# Patient Record
Sex: Female | Born: 1975 | Race: White | Hispanic: No | Marital: Married | Smoking: Current every day smoker
Health system: Southern US, Community
[De-identification: ages and names within clinical notes are randomized; demographics above are authoritative.]

## PROBLEM LIST (undated history)

## (undated) DIAGNOSIS — E7800 Pure hypercholesterolemia, unspecified: Secondary | ICD-10-CM

## (undated) DIAGNOSIS — I10 Essential (primary) hypertension: Secondary | ICD-10-CM

---

## 2003-06-05 ENCOUNTER — Other Ambulatory Visit: Payer: Self-pay

## 2003-09-06 ENCOUNTER — Other Ambulatory Visit: Payer: Self-pay

## 2003-12-22 ENCOUNTER — Emergency Department: Payer: Self-pay | Admitting: Emergency Medicine

## 2003-12-31 ENCOUNTER — Other Ambulatory Visit: Payer: Self-pay

## 2003-12-31 ENCOUNTER — Emergency Department: Payer: Self-pay | Admitting: Emergency Medicine

## 2004-01-01 ENCOUNTER — Ambulatory Visit: Payer: Self-pay | Admitting: Emergency Medicine

## 2004-01-03 ENCOUNTER — Emergency Department: Payer: Self-pay | Admitting: Emergency Medicine

## 2004-01-05 ENCOUNTER — Emergency Department: Payer: Self-pay | Admitting: Unknown Physician Specialty

## 2004-02-06 ENCOUNTER — Emergency Department: Payer: Self-pay | Admitting: Emergency Medicine

## 2004-02-16 ENCOUNTER — Emergency Department: Payer: Self-pay | Admitting: Emergency Medicine

## 2004-03-13 ENCOUNTER — Emergency Department: Payer: Self-pay | Admitting: Emergency Medicine

## 2004-03-20 ENCOUNTER — Ambulatory Visit: Payer: Self-pay | Admitting: Otolaryngology

## 2004-04-01 ENCOUNTER — Emergency Department: Payer: Self-pay | Admitting: Emergency Medicine

## 2005-09-01 ENCOUNTER — Emergency Department: Payer: Self-pay | Admitting: Internal Medicine

## 2005-10-31 ENCOUNTER — Emergency Department: Payer: Self-pay | Admitting: Emergency Medicine

## 2005-11-21 ENCOUNTER — Emergency Department: Payer: Self-pay | Admitting: Emergency Medicine

## 2006-01-12 ENCOUNTER — Emergency Department: Payer: Self-pay | Admitting: Emergency Medicine

## 2006-12-22 ENCOUNTER — Emergency Department: Payer: Self-pay | Admitting: Emergency Medicine

## 2007-01-11 ENCOUNTER — Emergency Department: Payer: Self-pay | Admitting: Emergency Medicine

## 2007-04-08 ENCOUNTER — Emergency Department: Payer: Self-pay | Admitting: Emergency Medicine

## 2007-05-23 ENCOUNTER — Ambulatory Visit: Payer: Self-pay | Admitting: Family Medicine

## 2007-09-01 ENCOUNTER — Ambulatory Visit: Payer: Self-pay | Admitting: Family Medicine

## 2007-09-20 ENCOUNTER — Ambulatory Visit: Payer: Self-pay | Admitting: Family Medicine

## 2007-11-30 ENCOUNTER — Ambulatory Visit: Payer: Self-pay | Admitting: Obstetrics and Gynecology

## 2007-12-17 ENCOUNTER — Observation Stay: Payer: Self-pay | Admitting: Obstetrics and Gynecology

## 2007-12-24 ENCOUNTER — Ambulatory Visit: Payer: Self-pay | Admitting: Internal Medicine

## 2008-04-18 ENCOUNTER — Ambulatory Visit: Payer: Self-pay | Admitting: Internal Medicine

## 2008-08-30 ENCOUNTER — Ambulatory Visit: Payer: Self-pay | Admitting: Internal Medicine

## 2008-10-24 ENCOUNTER — Emergency Department: Payer: Self-pay | Admitting: Emergency Medicine

## 2009-03-04 ENCOUNTER — Emergency Department: Payer: Self-pay | Admitting: Emergency Medicine

## 2009-04-18 ENCOUNTER — Ambulatory Visit: Payer: Self-pay | Admitting: Internal Medicine

## 2009-08-29 ENCOUNTER — Ambulatory Visit: Payer: Self-pay | Admitting: Obstetrics and Gynecology

## 2009-12-09 ENCOUNTER — Ambulatory Visit: Payer: Self-pay | Admitting: Internal Medicine

## 2009-12-25 ENCOUNTER — Ambulatory Visit: Payer: Self-pay | Admitting: Internal Medicine

## 2010-02-22 ENCOUNTER — Emergency Department: Payer: Self-pay | Admitting: Emergency Medicine

## 2010-02-22 ENCOUNTER — Ambulatory Visit: Payer: Self-pay | Admitting: Internal Medicine

## 2011-05-28 ENCOUNTER — Ambulatory Visit: Payer: Self-pay | Admitting: Obstetrics and Gynecology

## 2011-09-11 ENCOUNTER — Emergency Department: Payer: Self-pay | Admitting: Internal Medicine

## 2011-09-14 ENCOUNTER — Emergency Department: Payer: Self-pay | Admitting: Emergency Medicine

## 2012-01-26 ENCOUNTER — Ambulatory Visit: Payer: Self-pay

## 2012-02-02 ENCOUNTER — Ambulatory Visit: Payer: Self-pay

## 2012-02-16 ENCOUNTER — Ambulatory Visit: Payer: Self-pay

## 2012-02-23 ENCOUNTER — Ambulatory Visit: Payer: Self-pay

## 2012-03-08 ENCOUNTER — Ambulatory Visit: Payer: Self-pay

## 2012-09-22 ENCOUNTER — Ambulatory Visit: Payer: Self-pay | Admitting: Family Medicine

## 2012-11-15 ENCOUNTER — Emergency Department (HOSPITAL_COMMUNITY): Payer: Worker's Compensation

## 2012-11-15 ENCOUNTER — Emergency Department (HOSPITAL_COMMUNITY)
Admission: EM | Admit: 2012-11-15 | Discharge: 2012-11-15 | Disposition: A | Discharge status: Home / Self Care | Payer: Worker's Compensation | Attending: Emergency Medicine | Admitting: Emergency Medicine

## 2012-11-15 ENCOUNTER — Encounter (HOSPITAL_COMMUNITY): Payer: Self-pay | Admitting: Emergency Medicine

## 2012-11-15 DIAGNOSIS — F17200 Nicotine dependence, unspecified, uncomplicated: Secondary | ICD-10-CM | POA: Insufficient documentation

## 2012-11-15 DIAGNOSIS — Y990 Civilian activity done for income or pay: Secondary | ICD-10-CM | POA: Insufficient documentation

## 2012-11-15 DIAGNOSIS — M79672 Pain in left foot: Secondary | ICD-10-CM

## 2012-11-15 DIAGNOSIS — Z8639 Personal history of other endocrine, nutritional and metabolic disease: Secondary | ICD-10-CM | POA: Insufficient documentation

## 2012-11-15 DIAGNOSIS — Y9389 Activity, other specified: Secondary | ICD-10-CM | POA: Insufficient documentation

## 2012-11-15 DIAGNOSIS — S8990XA Unspecified injury of unspecified lower leg, initial encounter: Secondary | ICD-10-CM | POA: Insufficient documentation

## 2012-11-15 DIAGNOSIS — Y9289 Other specified places as the place of occurrence of the external cause: Secondary | ICD-10-CM | POA: Insufficient documentation

## 2012-11-15 DIAGNOSIS — Z862 Personal history of diseases of the blood and blood-forming organs and certain disorders involving the immune mechanism: Secondary | ICD-10-CM | POA: Insufficient documentation

## 2012-11-15 DIAGNOSIS — W208XXA Other cause of strike by thrown, projected or falling object, initial encounter: Secondary | ICD-10-CM | POA: Insufficient documentation

## 2012-11-15 DIAGNOSIS — I10 Essential (primary) hypertension: Secondary | ICD-10-CM | POA: Insufficient documentation

## 2012-11-15 HISTORY — DX: Essential (primary) hypertension: I10

## 2012-11-15 HISTORY — DX: Pure hypercholesterolemia, unspecified: E78.00

## 2012-11-15 NOTE — ED Notes (Addendum)
Left foot injury at work  After having heavy metal plate crunch foot  Need drug test for work

## 2012-11-15 NOTE — ED Provider Notes (Signed)
CSN: [id number]     Arrival date & time 11/15/12  1454 History  This chart was scribed for non-physician practitioner, Sharilyn Sites, PA-C working with Richardean Canal, MD by Greggory Stallion, ED scribe. This patient was seen in room TR06C/TR06C and the patient's care was started at 5:18 PM.   Chief Complaint  Patient presents with  . Foot Pain   The history is provided by the patient. No language interpreter was used.   HPI Comments: Caitlin Key is a 37 y.o. female who presents to the Emergency Department complaining of left foot injury that occurred earlier today at work. She states she was trying to lift a propane tank and dropped it on her foot. Pt has sudden onset throbbing left medial foot pain and some intermittent tingling. She states bearing weight worsens the pain but she is able to ambulate. Pt denies numbness.  No prior foot injuries.  No meds taken PTA.  States she needs UDS performed for work related injury.  Past Medical History  Diagnosis Date  . Hypertension   . High cholesterol    No past surgical history on file. No family history on file. History  Substance Use Topics  . Smoking status: Current Every Day Smoker  . Smokeless tobacco: Not on file  . Alcohol Use: Yes   OB History   Grav Para Term Preterm Abortions TAB SAB Ect Mult Living                 Review of Systems  Musculoskeletal: Positive for arthralgias.  Neurological: Negative for numbness.  All other systems reviewed and are negative.    Allergies  Sulfa antibiotics  Home Medications  No current outpatient prescriptions on file.  BP 125/77  Pulse 87  Temp(Src) 97.7 F (36.5 C) (Oral)  Resp 16  Wt 201 lb 3 oz (91.258 kg)  SpO2 97%  Physical Exam  Nursing note and vitals reviewed. Constitutional: She is oriented to person, place, and time. She appears well-developed and well-nourished. No distress.  HENT:  Head: Normocephalic and atraumatic.  Eyes: Conjunctivae and EOM are normal. Pupils  are equal, round, and reactive to light.  Neck: Normal range of motion. Neck supple.  Cardiovascular: Normal rate, regular rhythm and normal heart sounds.   Pulmonary/Chest: Effort normal and breath sounds normal. No respiratory distress. She has no wheezes.  Abdominal: Soft. Bowel sounds are normal. There is no tenderness. There is no guarding.  Musculoskeletal: Normal range of motion.       Left foot: She exhibits tenderness and swelling. She exhibits no deformity and no laceration.       Feet:  Tenderness to palpation of left medial foot. Mild swelling at proximal arch without bruising or deformity. Full ROM of ankle and toes. Strong distal pulse. Cap refill normal.   Neurological: She is alert and oriented to person, place, and time.  Sensation intact.   Skin: Skin is warm and dry. She is not diaphoretic.  Psychiatric: She has a normal mood and affect.    ED Course  ORTHOPEDIC INJURY TREATMENT Date/Time: 11/15/2012 6:49 PM Performed by: Garlon Hatchet Authorized by: Garlon Hatchet Consent: Verbal consent obtained. Consent given by: patient Patient understanding: patient states understanding of the procedure being performed Patient identity confirmed: verbally with patient Injury location: foot Location details: left foot Injury type: soft tissue Pre-procedure distal perfusion: normal Pre-procedure neurological function: normal Pre-procedure range of motion: normal Supplies used: elastic bandage Post-procedure neurovascular assessment: post-procedure neurovascularly intact Comments:  ACE wrap applied.  Normal neurovascular exam pre and post procedure.  Pt tolerated well without complication.   (including critical care time)  DIAGNOSTIC STUDIES: Oxygen Saturation is 97% on RA, normal by my interpretation.    COORDINATION OF CARE: 5:22 PM-Discussed treatment plan which includes pain medication with pt at bedside and pt agreed to plan. Advised pt to follow up with orthopedics  if symptoms do not resolve.   Labs Review Labs Reviewed - No data to display Imaging Review Dg Foot Complete Left  11/15/2012   CLINICAL DATA:  Left foot pain post fall  EXAM: LEFT FOOT - COMPLETE 3+ VIEW  COMPARISON:  None.  FINDINGS: Three views of left foot submitted. No acute fracture or subluxation. Plantar spur of calcaneus is noted.  IMPRESSION: No acute fracture or subluxation. Plantar spurring of calcaneus.   Electronically Signed   By: Natasha Mead M.D.   On: 11/15/2012 17:02    EKG Interpretation   None       MDM   1. Foot pain, left    X-ray negative for acute fracture or dislocation. Left foot stabilized with Ace wrap.  UDS collected by phlebotomy.  FU with PCP if problems occur.  Discussed plan with pt, she agreed.  Return precautions advised.  I personally performed the services described in this documentation, which was scribed in my presence. The recorded information has been reviewed and is accurate.   Garlon Hatchet, PA-C 11/15/12 [id number]

## 2012-11-15 NOTE — ED Provider Notes (Signed)
Medical screening examination/treatment/procedure(s) were performed by non-physician practitioner and as supervising physician I was immediately available for consultation/collaboration.  EKG Interpretation   None         Richardean Canal, MD 11/15/12 2246

## 2012-11-15 NOTE — ED Notes (Signed)
Patient reports an injury at work.  States that she stepped in a water access and injured her left foot. States that her arch is throbbing.  RN notes slight swelling on her left medial foot.

## 2012-11-16 ENCOUNTER — Telehealth (HOSPITAL_BASED_OUTPATIENT_CLINIC_OR_DEPARTMENT_OTHER): Payer: Self-pay

## 2013-03-26 ENCOUNTER — Emergency Department: Payer: Self-pay | Admitting: Emergency Medicine

## 2013-03-26 LAB — CBC
HCT: 39.8 % (ref 35.0–47.0)
HGB: 13.3 g/dL (ref 12.0–16.0)
MCH: 32.7 pg (ref 26.0–34.0)
MCHC: 33.4 g/dL (ref 32.0–36.0)
MCV: 98 fL (ref 80–100)
Platelet: 313 10*3/uL (ref 150–440)
RBC: 4.07 10*6/uL (ref 3.80–5.20)
RDW: 12.3 % (ref 11.5–14.5)
WBC: 6 10*3/uL (ref 3.6–11.0)

## 2013-03-26 LAB — BASIC METABOLIC PANEL WITH GFR
Anion Gap: 5 — ABNORMAL LOW
BUN: 13 mg/dL
Calcium, Total: 9.1 mg/dL
Chloride: 103 mmol/L
Co2: 27 mmol/L
Creatinine: 0.85 mg/dL
EGFR (African American): >60
EGFR (Non-African Amer.): >60
Glucose: 77 mg/dL
Osmolality: 269
Potassium: 4 mmol/L
Sodium: 135 mmol/L — ABNORMAL LOW

## 2013-03-26 LAB — URINALYSIS, COMPLETE
Bacteria: NONE SEEN
Bilirubin,UR: NEGATIVE
Blood: NEGATIVE
GLUCOSE, UR: NEGATIVE mg/dL (ref 0–75)
KETONE: NEGATIVE
Leukocyte Esterase: NEGATIVE
NITRITE: NEGATIVE
Ph: 7 (ref 4.5–8.0)
Protein: NEGATIVE
RBC,UR: <1 /HPF (ref 0–5)
SQUAMOUS EPITHELIAL: NONE SEEN
Specific Gravity: 1.005 (ref 1.003–1.030)
WBC UR: NONE SEEN /HPF (ref 0–5)

## 2013-03-26 LAB — PRO B NATRIURETIC PEPTIDE: B-Type Natriuretic Peptide: 331 pg/mL — ABNORMAL HIGH

## 2013-03-26 LAB — TROPONIN I: Troponin-I: <0.02 ng/mL

## 2013-04-07 ENCOUNTER — Ambulatory Visit: Payer: Self-pay | Admitting: Cardiology

## 2013-04-19 ENCOUNTER — Ambulatory Visit: Payer: Self-pay | Admitting: Vascular Surgery

## 2013-04-19 LAB — URINALYSIS, COMPLETE
Bilirubin,UR: NEGATIVE
Blood: NEGATIVE
Glucose,UR: NEGATIVE mg/dL (ref 0–75)
Ketone: NEGATIVE
LEUKOCYTE ESTERASE: NEGATIVE
Nitrite: NEGATIVE
PROTEIN: NEGATIVE
Ph: 5 (ref 4.5–8.0)
Specific Gravity: 1.021 (ref 1.003–1.030)

## 2013-04-19 LAB — BASIC METABOLIC PANEL
Anion Gap: 3 — ABNORMAL LOW (ref 7–16)
BUN: 20 mg/dL — ABNORMAL HIGH (ref 7–18)
Calcium, Total: 9.3 mg/dL (ref 8.5–10.1)
Chloride: 102 mmol/L (ref 98–107)
Co2: 29 mmol/L (ref 21–32)
Creatinine: 1.06 mg/dL (ref 0.60–1.30)
EGFR (African American): >60
EGFR (Non-African Amer.): >60
Glucose: 83 mg/dL (ref 65–99)
Osmolality: 270 (ref 275–301)
Potassium: 4 mmol/L (ref 3.5–5.1)
Sodium: 134 mmol/L — ABNORMAL LOW (ref 136–145)

## 2013-04-19 LAB — CBC
HCT: 40.7 % (ref 35.0–47.0)
HGB: 13.4 g/dL (ref 12.0–16.0)
MCH: 32.5 pg (ref 26.0–34.0)
MCHC: 32.9 g/dL (ref 32.0–36.0)
MCV: 99 fL (ref 80–100)
Platelet: 350 10*3/uL (ref 150–440)
RBC: 4.12 10*6/uL (ref 3.80–5.20)
RDW: 12.9 % (ref 11.5–14.5)
WBC: 5.8 10*3/uL (ref 3.6–11.0)

## 2013-04-19 LAB — APTT: Activated PTT: 54.7 secs — ABNORMAL HIGH (ref 23.6–35.9)

## 2013-04-21 LAB — PATHOLOGY REPORT

## 2014-05-06 NOTE — Op Note (Signed)
PATIENT NAME:  Caitlin Key, [name] MR#:  [id number] DATE OF BIRTH:  Jan 09, 1976  DATE OF PROCEDURE:  04/19/2013  PREOPERATIVE DIAGNOSES: 1.  Thrombosis of right radial artery, status post catheterization.  2.  Hypertension.  3.  Obesity.   POSTOPERATIVE DIAGNOSES: 1.  Thrombosis of right radial artery, status post catheterization.  2.  Hypertension.  3.  Obesity.   PROCEDURE PERFORMED:  Open exploration of the radial artery at the wrist with a Fogarty  thrombectomy of the radial artery.   SURGEON: Renford DillsGregory G. Dariah Mcsorley MD  ANESTHESIA: General by LMA.   FLUIDS: Per anesthesia record.   ESTIMATED BLOOD LOSS: 50 mL.   SPECIMEN: Thrombus recovered from the artery to pathology permanent section.   INDICATIONS: Ms. [name] is a 39 year old woman who underwent cardiac catheterization recently and developed increasing pain in her right arm. The physical examination as well as noninvasive studies demonstrated thrombosis of her right radial artery. The risks and benefits for open thrombectomy were reviewed. All questions were answered. The patient agrees to proceed.   DESCRIPTION OF PROCEDURE: The patient is taken to the Operating Room and placed in the supine position. After adequate general anesthesia is induced and appropriate invasive monitors are placed, she is positioned supine with her right arm extended palm upward. The right arm is prepped and draped in a circumferential fashion. Appropriate timeout is called.   A linear incision is then created overlying the long axis of the radial artery at the wrist and the dissection is carried down opening the fascia and exposing the radial artery which is dissected proximally and distally circumferentially and looped with Silastic vessel loops. Heparin 5000 units was given. A transverse incision was created with an 11-blade scalpel and subsequently a 2-French Fogarty catheter was utilized to perform thrombectomy. Two passes were made going distally with  return of pulsatile blood flow. Approximately 4 passes were made going proximally with return of pulsatile blood flow. The radial artery was then irrigated copiously with heparinized saline and closed using interrupted 7-0 Prolene sutures. Flushing maneuvers were performed before the last 2 stitches were secured.   A small amount of Avitene and a piece of Surgicel were placed along the artery after the wound is irrigated and incision is then closed in layers using running 3-0 Vicryl followed by 4-0 Monocryl subcuticular and then Dermabond. The patient tolerated the procedure well. There were no immediate complications. Sponge and needle counts were correct and she is taken to the recovery area in stable condition.   ____________________________ Renford DillsGregory G. Michelangelo Rindfleisch, MD ggs:cs D: 04/19/2013 19:17:01 ET T: 04/19/2013 19:26:53 ET JOB#: [id number]  cc: Renford DillsGregory G. Tykira Wachs, MD, <Dictator> Meindert A. Lacie ScottsNiemeyer, MD Marcina MillardAlexander Paraschos, MD Renford DillsGREGORY G Julian Askin MD ELECTRONICALLY SIGNED 05/03/2013 11:16

## 2015-08-26 IMAGING — CR DG CHEST 1V PORT
1 series · 1 of 1 positions shown · non-contrast
Comparison: 02/22/2010

CLINICAL DATA: Chest pain and shortness of breath

EXAM:
PORTABLE CHEST - 1 VIEW

[ap]
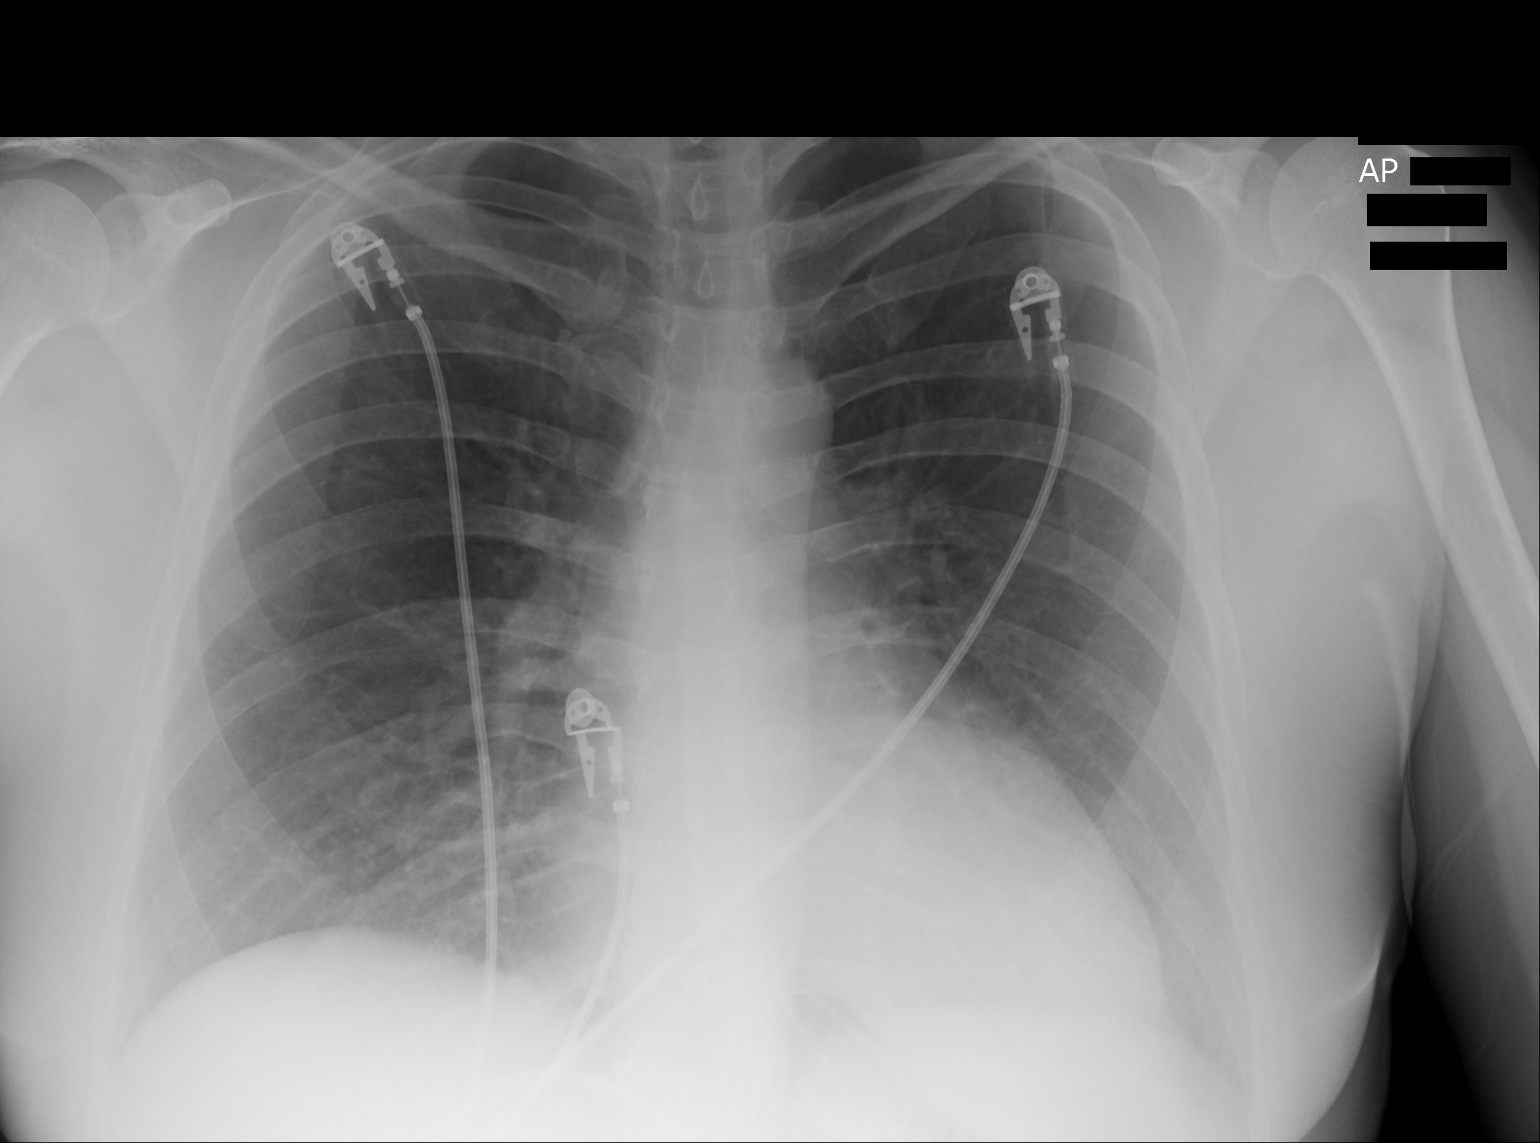

[1 of 1 positions shown; findings below may reference images not displayed]

FINDINGS: Cardiac shadow is within normal limits. The lungs are well aerated
bilaterally. No acute bony abnormality is seen.
IMPRESSION: No active disease.

## 2020-05-17 ENCOUNTER — Ambulatory Visit: Admission: EM | Admit: 2020-05-17 | Payer: Self-pay

## 2020-05-17 ENCOUNTER — Ambulatory Visit
Admission: EM | Admit: 2020-05-17 | Discharge: 2020-05-17 | Disposition: A | Discharge status: Home / Self Care | Payer: BC Managed Care – PPO

## 2020-05-17 ENCOUNTER — Other Ambulatory Visit: Payer: Self-pay

## 2020-05-17 DIAGNOSIS — J0100 Acute maxillary sinusitis, unspecified: Secondary | ICD-10-CM | POA: Diagnosis not present

## 2020-05-17 MED ORDER — IPRATROPIUM BROMIDE 0.06 % NA SOLN: 2.0000 | Freq: Four times a day (QID) | NASAL | 12 refills | Status: AC

## 2020-05-17 MED ORDER — DOXYCYCLINE HYCLATE 100 MG PO CAPS: 100.0000 mg | ORAL_CAPSULE | Freq: Two times a day (BID) | ORAL | 0 refills | Status: AC

## 2020-05-17 MED ORDER — METHYLPREDNISOLONE SODIUM SUCC 125 MG IJ SOLR
125.0000 mg | Freq: Once | INTRAMUSCULAR | Status: AC
  Administered 2020-05-17: 125 mg via INTRAMUSCULAR

## 2020-05-17 MED ORDER — IPRATROPIUM BROMIDE 0.06 % NA SOLN: 2.0000 | Freq: Four times a day (QID) | NASAL | 12 refills | Status: DC

## 2020-05-17 NOTE — ED Provider Notes (Signed)
[facility]    CSN: [id number] Arrival date & time: 05/17/20  1648      History   Chief Complaint Chief Complaint  Patient presents with  . Facial Pain    HPI Caitlin Key is a 45 y.o. female presenting for 2-week history of nasal congestion, right-sided facial pain and right ear pain.  Patient says she had a cough at onset of symptoms but that has resolved.  Patient did have a telemedicine visit at onset of symptoms and was treated with Augmentin for sinus infection.  Patient says she has recurrent sinus infections and typically has about 1 to 2/year.  She says she has a deviated septum and was advised to have surgery, but patient says she does not want to have surgery so she usually gets antibiotics.  Patient says she normally gets better with Augmentin, but did not this time and she is in town for her daughter's graduation.  Patient states she wants to feel better by this weekend.  She has also been using Flonase and taking decongestants.  Patient denies any fevers or worsening of symptoms.  Patient is from [place] and is traveling home after this weekend.  She says that she can follow-up with her PCP when she gets home if needed.  She did have a negative COVID test when symptoms began.  She has no other concerns today.  HPI  Past Medical History:  Diagnosis Date  . High cholesterol   . Hypertension     There are no problems to display for this patient.   History reviewed. No pertinent surgical history.  OB History   No obstetric history on file.      Home Medications    Prior to Admission medications   Medication Sig Start Date End Date Taking? Authorizing Provider  clonazePAM (KLONOPIN) 0.5 MG tablet  10/04/13  Yes [provider]  doxycycline (VIBRAMYCIN) 100 MG capsule Take 1 capsule (100 mg total) by mouth 2 (two) times daily for 7 days. 05/17/20 05/24/20 Yes Eusebio Friendly B, PA-C  ipratropium (ATROVENT) 0.06 % nasal spray Place 2 sprays into  both nostrils 4 (four) times daily. 05/17/20   Eusebio Friendly B, PA-C  pravastatin (PRAVACHOL) 10 MG tablet Take 10 mg by mouth at bedtime. 05/14/20   [provider]  propranolol ER (INDERAL LA) 120 MG 24 hr capsule Take 120 mg by mouth daily.  09/07/12   [provider]  simvastatin (ZOCOR) 20 MG tablet Take 20 mg by mouth at bedtime.  09/07/12   [provider]  valsartan (DIOVAN) 80 MG tablet valsartan 80 mg tablet    [provider]    Family History No family history on file.  Social History Social History   Tobacco Use  . Smoking status: Current Every Day Smoker  . Smokeless tobacco: Never Used  Substance Use Topics  . Alcohol use: Yes  . Drug use: Not Currently     Allergies   Biaxin [clarithromycin] and Sulfa antibiotics   Review of Systems Review of Systems  Constitutional: Negative for chills, diaphoresis, fatigue and fever.  HENT: Positive for congestion, ear pain, rhinorrhea, sinus pressure and sinus pain. Negative for sore throat.   Respiratory: Negative for cough and shortness of breath.   Gastrointestinal: Negative for abdominal pain, nausea and vomiting.  Musculoskeletal: Negative for arthralgias and myalgias.  Skin: Negative for rash.  Neurological: Positive for headaches. Negative for weakness.  Hematological: Negative for adenopathy.     Physical  Exam Triage Vital Signs ED Triage Vitals [05/17/20 1701]  Enc Vitals Group     BP 124/85     Pulse Rate 89     Resp 16     Temp 98.8 F (37.1 C)     Temp Source Oral     SpO2 100 %     Weight 190 lb (86.2 kg)     Height 5\' 7"  (1.702 m)     Head Circumference      Peak Flow      Pain Score 4     Pain Loc      Pain Edu?      Excl. in GC?    No data found.  Updated Vital Signs BP 124/85   Pulse 89   Temp 98.8 F (37.1 C) (Oral)   Resp 16   Ht 5\' 7"  (1.702 m)   Wt 190 lb (86.2 kg)   SpO2 100%   BMI 29.76 kg/m       Physical Exam Vitals and nursing note  reviewed.  Constitutional:      General: She is not in acute distress.    Appearance: Normal appearance. She is not ill-appearing or toxic-appearing.  HENT:     Head: Normocephalic and atraumatic.     Right Ear: Hearing, ear canal and external ear normal. A middle ear effusion is present. Tympanic membrane is injected.     Left Ear: Hearing, tympanic membrane, ear canal and external ear normal.     Nose: Septal deviation, mucosal edema, congestion and rhinorrhea present.     Right Sinus: Maxillary sinus tenderness present.     Mouth/Throat:     Mouth: Mucous membranes are moist.     Pharynx: Oropharynx is clear. No posterior oropharyngeal erythema.  Eyes:     General: No scleral icterus.       Right eye: No discharge.        Left eye: No discharge.     Conjunctiva/sclera: Conjunctivae normal.  Cardiovascular:     Rate and Rhythm: Normal rate and regular rhythm.     Heart sounds: Normal heart sounds.  Pulmonary:     Effort: Pulmonary effort is normal. No respiratory distress.     Breath sounds: Normal breath sounds.  Musculoskeletal:     Cervical back: Neck supple.  Skin:    General: Skin is dry.  Neurological:     General: No focal deficit present.     Mental Status: She is alert. Mental status is at baseline.     Motor: No weakness.     Gait: Gait normal.  Psychiatric:        Mood and Affect: Mood normal.        Behavior: Behavior normal.        Thought Content: Thought content normal.      UC Treatments / Results  Labs (all labs ordered are listed, but only abnormal results are displayed) Labs Reviewed - No data to display  EKG   Radiology No results found.  Procedures Procedures (including critical care time)  Medications Ordered in UC Medications  methylPREDNISolone sodium succinate (SOLU-MEDROL) 125 mg/2 mL injection 125 mg (125 mg Intramuscular Given 05/17/20 1727)    Initial Impression / Assessment and Plan / UC Course  I have reviewed the triage  vital signs and the nursing notes.  Pertinent labs & imaging results that were available during my care of the patient were reviewed by me and considered in my medical decision making (see  chart for details).   45 year old female presenting for 2-week history of right-sided sinus pain and ear pain.  She has history of recurrent sinus infections.  She has not had any significant improvement with Augmentin which she finished yesterday.  Since she has had treatment failure with Augmentin, will step up treatment to doxycycline.  Advised to continue OTC decongestants.  I did offer patient prednisone, but she says that medication upsets her stomach.  She says she has done well with injections in the past.  Patient given 125 IM Solu-Medrol in clinic.  Also sent Atrovent nasal spray.  Encouraged increasing rest and fluids.  Advised her to follow-up with her PCP if not improving when she returns to [place].  Reviewed ED precautions with patient.   Final Clinical Impressions(s) / UC Diagnoses   Final diagnoses:  Acute maxillary sinusitis, recurrence not specified     Discharge Instructions     We have given you an injection of a corticosteroid.  I have sent doxycycline to the pharmacy since your symptoms do not improve with the Augmentin.  I have also sent a nasal spray.  Increase rest and fluids.  If you are not feeling better after you have finished the doxycycline, you may need to follow-up with the ENT specialist.  Or, follow-up with your PCP.    ED Prescriptions    Medication Sig Dispense Auth. Provider   doxycycline (VIBRAMYCIN) 100 MG capsule Take 1 capsule (100 mg total) by mouth 2 (two) times daily for 7 days. 14 capsule Eusebio Friendly B, PA-C   ipratropium (ATROVENT) 0.06 % nasal spray  (Status: Discontinued) Place 2 sprays into both nostrils 4 (four) times daily. 15 mL Eusebio Friendly B, PA-C   ipratropium (ATROVENT) 0.06 % nasal spray Place 2 sprays into both nostrils 4 (four) times daily.  15 mL Shirlee Latch, PA-C     PDMP not reviewed this encounter.   Shirlee Latch, PA-C 05/17/20 1735

## 2020-05-17 NOTE — ED Triage Notes (Signed)
Pt sts treated for a sinus infection on last Monday with amoxicillin. sts she is still having head pressure and bila ear pain.

## 2020-05-17 NOTE — Discharge Instructions (Signed)
We have given you an injection of a corticosteroid.  I have sent doxycycline to the pharmacy since your symptoms do not improve with the Augmentin.  I have also sent a nasal spray.  Increase rest and fluids.  If you are not feeling better after you have finished the doxycycline, you may need to follow-up with the ENT specialist.  Or, follow-up with your PCP.
